# Patient Record
Sex: Female | Born: 1995 | Race: Black or African American | Hispanic: No | Marital: Single | Smoking: Never smoker
Health system: Southern US, Community
[De-identification: ages and names within clinical notes are randomized; demographics above are authoritative.]

## PROBLEM LIST (undated history)

## (undated) DIAGNOSIS — G43909 Migraine, unspecified, not intractable, without status migrainosus: Secondary | ICD-10-CM

## (undated) DIAGNOSIS — G935 Compression of brain: Secondary | ICD-10-CM

## (undated) HISTORY — PX: NO PAST SURGERIES: SHX2092

---

## 2017-05-02 ENCOUNTER — Encounter: Payer: Self-pay | Admitting: Emergency Medicine

## 2017-05-02 ENCOUNTER — Emergency Department
Admission: EM | Admit: 2017-05-02 | Discharge: 2017-05-03 | Disposition: A | Discharge status: Home / Self Care | Payer: BLUE CROSS/BLUE SHIELD | Attending: Emergency Medicine | Admitting: Emergency Medicine

## 2017-05-02 ENCOUNTER — Emergency Department: Payer: BLUE CROSS/BLUE SHIELD

## 2017-05-02 DIAGNOSIS — M25511 Pain in right shoulder: Secondary | ICD-10-CM | POA: Diagnosis not present

## 2017-05-02 HISTORY — DX: Compression of brain: G93.5

## 2017-05-02 MED ORDER — IBUPROFEN 400 MG PO TABS
400.0000 mg | ORAL_TABLET | Freq: Once | ORAL | Status: AC | PRN
  Administered 2017-05-02: 400 mg via ORAL

## 2017-05-02 MED ORDER — IBUPROFEN 400 MG PO TABS
ORAL_TABLET | ORAL | Status: AC
  Filled 2017-05-02: qty 1
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
## 2017-05-02 NOTE — ED Notes (Signed)
Ice pack given to pt.

## 2017-05-03 ENCOUNTER — Encounter: Payer: Self-pay | Admitting: Emergency Medicine

## 2017-05-03 MED ORDER — IBUPROFEN 600 MG PO TABS
600.0000 mg | ORAL_TABLET | Freq: Once | ORAL | Status: AC
  Administered 2017-05-03: 600 mg via ORAL
  Filled 2017-05-03: qty 1

## 2017-05-03 MED ORDER — ACETAMINOPHEN 500 MG PO TABS
1000.0000 mg | ORAL_TABLET | Freq: Once | ORAL | Status: AC
  Administered 2017-05-03: 1000 mg via ORAL
  Filled 2017-05-03: qty 2
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
## 2019-06-02 ENCOUNTER — Other Ambulatory Visit: Payer: Self-pay

## 2019-06-02 DIAGNOSIS — Z20822 Contact with and (suspected) exposure to covid-19: Secondary | ICD-10-CM

## 2019-06-04 LAB — NOVEL CORONAVIRUS, NAA: SARS-CoV-2, NAA: NOT DETECTED

## 2019-10-04 ENCOUNTER — Ambulatory Visit: Payer: PRIVATE HEALTH INSURANCE | Attending: Internal Medicine

## 2019-10-04 DIAGNOSIS — Z20822 Contact with and (suspected) exposure to covid-19: Secondary | ICD-10-CM

## 2019-10-05 LAB — NOVEL CORONAVIRUS, NAA: SARS-CoV-2, NAA: NOT DETECTED

## 2020-09-14 ENCOUNTER — Emergency Department
Admission: EM | Admit: 2020-09-14 | Discharge: 2020-09-14 | Disposition: A | Discharge status: Home / Self Care | Payer: BLUE CROSS/BLUE SHIELD | Attending: Emergency Medicine | Admitting: Emergency Medicine

## 2020-09-14 ENCOUNTER — Other Ambulatory Visit: Payer: Self-pay

## 2020-09-14 ENCOUNTER — Encounter: Payer: Self-pay | Admitting: Emergency Medicine

## 2020-09-14 ENCOUNTER — Emergency Department: Payer: BLUE CROSS/BLUE SHIELD

## 2020-09-14 DIAGNOSIS — G8929 Other chronic pain: Secondary | ICD-10-CM | POA: Insufficient documentation

## 2020-09-14 DIAGNOSIS — R519 Headache, unspecified: Secondary | ICD-10-CM | POA: Insufficient documentation

## 2020-09-14 MED ORDER — DIPHENHYDRAMINE HCL 25 MG PO CAPS
25.0000 mg | ORAL_CAPSULE | Freq: Once | ORAL | Status: AC
  Administered 2020-09-14: 25 mg via ORAL
  Filled 2020-09-14: qty 1

## 2020-09-14 MED ORDER — KETOROLAC TROMETHAMINE 30 MG/ML IJ SOLN
30.0000 mg | Freq: Once | INTRAMUSCULAR | Status: AC
  Administered 2020-09-14: 30 mg via INTRAMUSCULAR
  Filled 2020-09-14: qty 1

## 2020-09-14 MED ORDER — CYCLOBENZAPRINE HCL 5 MG PO TABS: 5.0000 mg | ORAL_TABLET | Freq: Three times a day (TID) | ORAL | 0 refills | Status: DC | PRN

## 2020-09-14 MED ORDER — KETOROLAC TROMETHAMINE 10 MG PO TABS: 10.0000 mg | ORAL_TABLET | Freq: Three times a day (TID) | ORAL | 0 refills | Status: DC

## 2020-09-14 MED ORDER — METOCLOPRAMIDE HCL 10 MG PO TABS: 10.0000 mg | ORAL_TABLET | Freq: Three times a day (TID) | ORAL | 0 refills | Status: DC | PRN

## 2020-09-14 MED ORDER — CYCLOBENZAPRINE HCL 10 MG PO TABS
10.0000 mg | ORAL_TABLET | Freq: Once | ORAL | Status: AC
  Administered 2020-09-14: 10 mg via ORAL
  Filled 2020-09-14: qty 1

## 2020-09-14 MED ORDER — METOCLOPRAMIDE HCL 10 MG PO TABS
10.0000 mg | ORAL_TABLET | Freq: Once | ORAL | Status: AC
  Administered 2020-09-14: 10 mg via ORAL
  Filled 2020-09-14: qty 1

## 2020-09-14 NOTE — ED Provider Notes (Signed)
[facility] Emergency Department Provider Note ____________________________________________  Time seen: 1955  I have reviewed the triage vital signs and the nursing notes.  HISTORY  Chief Complaint  Headache  HPI Beth Reese is a 24 y.o. female presents her self to the ED for evaluation of a chronic headache.  Patient describes a diagnosis when she was about 24 years old, in [place], of a Chiari malformation.  She reports she recalls her parents being told that surgery was an option, but they declined at that time.  Patient reports that she has had persistent near daily headaches, since that time.  She generally will take sporadic, low doses of Tylenol and Motrin  for headache pain relief, but reports she rarely is without headache pain.  She denies any recent trauma, falls, visual disturbance, dizziness, weakness, paresthesias or paralysis.  She presents to the ED today, because her typical headache is a less sharp in nature.  She also reports some movement of the headache from the occiput to the temple, to the forehead, and to the top of the head.  She denies any chest pain, shortness of breath, fever, chills, sweats but also denies any nausea, vomiting, or dizziness.  Patient has not establish care with a primary provider here nor she been referred to or evaluated by a neurologist.  She denies any other significant medical history, and takes no daily prescription medications.  Past Medical History:  Diagnosis Date  . Chiari malformation type I (HCC)     There are no problems to display for this patient.   History reviewed. No pertinent surgical history.  Prior to Admission medications   Medication Sig Start Date End Date Taking? Authorizing Provider  cyclobenzaprine (FLEXERIL) 5 MG tablet Take 1 tablet (5 mg total) by mouth 3 (three) times daily as needed. 09/14/20   Bufford Helms, Charlesetta Ivory, PA-C  ketorolac (TORADOL) 10 MG tablet Take 1 tablet (10 mg  total) by mouth every 8 (eight) hours. 09/14/20   Natara Monfort, Charlesetta Ivory, PA-C  metoCLOPramide (REGLAN) 10 MG tablet Take 1 tablet (10 mg total) by mouth every 8 (eight) hours as needed for up to 10 days for nausea or vomiting. 09/14/20 09/24/20  Uthman Mroczkowski, Charlesetta Ivory, PA-C    Allergies Penicillins  No family history on file.  Social History Social History   Tobacco Use  . Smoking status: Never Smoker  . Smokeless tobacco: Never Used  Substance Use Topics  . Alcohol use: No    Review of Systems  Constitutional: Negative for fever. Eyes: Negative for visual changes. ENT: Negative for sore throat. Cardiovascular: Negative for chest pain. Respiratory: Negative for shortness of breath. Gastrointestinal: Negative for abdominal pain, vomiting and diarrhea. Genitourinary: Negative for dysuria. Musculoskeletal: Negative for back pain. Skin: Negative for rash. Neurological: Positive for headaches. Denies focal weakness or numbness. ____________________________________________  PHYSICAL EXAM:  VITAL SIGNS: ED Triage Vitals  Enc Vitals Group     BP 09/14/20 1753 110/68     Pulse Rate 09/14/20 1753 61     Resp 09/14/20 1753 16     Temp 09/14/20 1753 98.4 F (36.9 C)     Temp Source 09/14/20 1753 Oral     SpO2 09/14/20 1753 100 %     Weight 09/14/20 1751 130 lb (59 kg)     Height 09/14/20 1751 5\' 4"  (1.626 m)     Head Circumference --      Peak Flow --      Pain Score  09/14/20 1751 7     Pain Loc --      Pain Edu? --      Excl. in GC? --     Constitutional: Alert and oriented. Well appearing and in no distress. GCS = 15 Head: Normocephalic and atraumatic. Eyes: Conjunctivae are normal. PERRL. Normal extraocular movements and fundi bilaterally  Neck: Supple. Normal ROM. No rigidity.  Cardiovascular: Normal rate, regular rhythm. Normal distal pulses. Respiratory: Normal respiratory effort. No wheezes/rales/rhonchi. Gastrointestinal: Soft and nontender. No  distention. Musculoskeletal: Nontender with normal range of motion in all extremities.  Neurologic:  Normal gait without ataxia. Normal speech and language. No gross focal neurologic deficits are appreciated. Skin:  Skin is warm, dry and intact. No rash noted. Psychiatric: Mood and affect are normal. Patient exhibits appropriate insight and judgment. ____________________________________________   RADIOLOGY  CT Head w/o CM  Negative ____________________________________________  PROCEDURES  Toradol 30 mg IM Reglan 10 mg PO Benadryl 25 mg PO Flexeril 10 mg PO  Procedures ____________________________________________  INITIAL IMPRESSION / ASSESSMENT AND PLAN / ED COURSE  Differential diagnosis includes, but is not limited to, intracranial hemorrhage, meningitis/encephalitis, previous head trauma, cavernous venous thrombosis, tension headache, temporal arteritis, migraine or migraine equivalent, idiopathic intracranial hypertension, and non-specific headache.  The ED evaluation of chronic headache syndrome diagnosis as a pediatric patient with Chiari malformation, presents with worsening headache over the last week.  Patient's exam is overall benign reassurance time.  No signs of acute neuromuscular deficit, visual disturbance, or acute cerebellar ataxia.  She has not had follow-up since her diagnosis as a teenager.  She does indicate that time of disposition that her headache pain is improved to a 3 out of 10 at this time.  She will continue to monitor and treat symptoms of the headache as discussed.  She will be referred to neurology for further evaluation definitive management of her headache syndrome.  Beth Reese was evaluated in Emergency Department on 09/14/2020 for the symptoms described in the history of present illness. She was evaluated in the context of the global COVID-19 pandemic, which necessitated consideration that the patient might be at risk for infection with the  SARS-CoV-2 virus that causes COVID-19. Institutional protocols and algorithms that pertain to the evaluation of patients at risk for COVID-19 are in a state of rapid change based on information released by regulatory bodies including the CDC and federal and state organizations. These policies and algorithms were followed during the patient's care in the ED. ____________________________________________  FINAL CLINICAL IMPRESSION(S) / ED DIAGNOSES  Final diagnoses:  Headache disorder      Karmen Stabs, Charlesetta Ivory, PA-C 09/14/20 2225    Merwyn Katos, MD 09/15/20 [phone]

## 2020-09-14 NOTE — Discharge Instructions (Signed)
Your exam and CT scan are normal at this time.  You should follow-up with [facility] neurology for further management of your headache syndrome.  Take the prescription medications as prescribed.  Also use over-the-counter Benadryl as discussed for headache management.  Return to the ED if needed.
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
## 2020-09-14 NOTE — ED Triage Notes (Signed)
Pt to ED via POV stating that she has been having headaches over the last week and a half. Pt is in NAD.

## 2020-09-20 ENCOUNTER — Other Ambulatory Visit: Payer: Self-pay

## 2020-09-20 ENCOUNTER — Emergency Department
Admission: EM | Admit: 2020-09-20 | Discharge: 2020-09-21 | Discharge status: Against Medical Advice | Payer: PRIVATE HEALTH INSURANCE

## 2020-09-20 ENCOUNTER — Ambulatory Visit
Admission: EM | Admit: 2020-09-20 | Discharge: 2020-09-20 | Disposition: A | Discharge status: Home / Self Care | Payer: BLUE CROSS/BLUE SHIELD | Attending: Physician Assistant | Admitting: Physician Assistant

## 2020-09-20 ENCOUNTER — Encounter: Payer: Self-pay | Admitting: Emergency Medicine

## 2020-09-20 DIAGNOSIS — G8929 Other chronic pain: Secondary | ICD-10-CM | POA: Diagnosis not present

## 2020-09-20 DIAGNOSIS — R519 Headache, unspecified: Secondary | ICD-10-CM | POA: Diagnosis not present

## 2020-09-20 DIAGNOSIS — R110 Nausea: Secondary | ICD-10-CM

## 2020-09-20 HISTORY — DX: Migraine, unspecified, not intractable, without status migrainosus: G43.909

## 2020-09-20 MED ORDER — PROMETHAZINE HCL 25 MG PO TABS: 25.0000 mg | ORAL_TABLET | Freq: Four times a day (QID) | ORAL | 0 refills | Status: DC | PRN

## 2020-09-20 MED ORDER — RIZATRIPTAN BENZOATE 10 MG PO TABS: ORAL_TABLET | ORAL | 0 refills | Status: DC

## 2020-09-20 MED ORDER — PREDNISONE 50 MG PO TABS: 50.0000 mg | ORAL_TABLET | Freq: Every day | ORAL | 0 refills | Status: AC

## 2020-09-20 NOTE — Discharge Instructions (Signed)
When you get home, take the rizatriptan tablet with the promethazine tablet.  Can repeat rizatriptan in 2 hours if your headache is not improved.  You can take the promethazine as needed for nausea/vomiting or when you take the rizatriptan.  Wait to start the prednisone until tomorrow if you have not had any improvement with this regimen today.  Keep your follow-up with the neurologist.  Go back to the emergency department for any severe acute worsening of your headache or other associated symptoms of dizziness, weakness, confusion, speech problems, balance difficulty, falls, etc.

## 2020-09-20 NOTE — ED Triage Notes (Signed)
Patient in today c/o migraine x this morning. Patient states she has chronic migraines and was seen in at [facility] ED on 09/14/20. Patient had a head CT at that time that was normal. Patient states ED prescribed medication, but states it isn't helping. Patient has not taken any medications today for her headache.

## 2020-09-20 NOTE — ED Provider Notes (Signed)
[facility]    CSN: [id number] Arrival date & time: 09/20/20  1035      History   Chief Complaint Chief Complaint  Patient presents with  . Migraine    HPI Beth Reese is a 24 y.o. female presenting for evaluation and management of chronic headaches for nearly the past 7 years.  She states that she has headaches almost every day.  She says that usually they are dull but sometimes symptoms worsen.  Currently she states that she has had increased throbbing pain of the frontal region and occipital part of her head.  She said this is associated with nausea, but not vomiting.  Admits to occasional dizziness and lightheadedness, but denies any falls or syncope.  Denies any visual changes.  Denies any confusion or balance difficulty.  Denies any numbness, tingling or weakness.    Patient is to history of Chiari malformation which was diagnosed when she was a teenager.  Patient states that she was advised when she was younger that she may need to have a surgery, but has not followed up with anyone in the last several years about this.  Patient states that she was seen in the ED a few days ago for her headache/migraine.  She states that she had a CT scan at that time and received multiple medications which seem to help her headache.  She was prescribed the same medications which are Toradol, Reglan, and Flexeril.  Patient states that she took those medications that helped her previously and states that they do not seem to have helped her since.  Patient denies any worsening of headaches, but says she has not had any improvement.  She does state that her current headache is not as bad as it was when she went to the ED, however.   Patient states that she normally takes ibuprofen and Tylenol for headaches and sometimes it works, but recently her headaches seem to be worse than normal and not responding to the usual medications that she is tried.  She did get a referral to a neurologist  when she went to the ED, but has not received a call back from them yet.  Patient states that she is supposed to work today at 3 PM and does not think she can work and request a work note.  She has no other concerns today.  HPI  Past Medical History:  Diagnosis Date  . Chiari malformation type I (HCC)   . Migraine     There are no problems to display for this patient.   Past Surgical History:  Procedure Laterality Date  . NO PAST SURGERIES      OB History   No obstetric history on file.      Home Medications    Prior to Admission medications   Medication Sig Start Date End Date Taking? Authorizing Provider  cyclobenzaprine (FLEXERIL) 5 MG tablet Take 1 tablet (5 mg total) by mouth 3 (three) times daily as needed. 09/14/20  Yes Menshew, Charlesetta Ivory, PA-C  ketorolac (TORADOL) 10 MG tablet Take 1 tablet (10 mg total) by mouth every 8 (eight) hours. 09/14/20  Yes Menshew, Charlesetta Ivory, PA-C  metoCLOPramide (REGLAN) 10 MG tablet Take 1 tablet (10 mg total) by mouth every 8 (eight) hours as needed for up to 10 days for nausea or vomiting. 09/14/20 09/24/20 Yes Menshew, Charlesetta Ivory, PA-C  predniSONE (DELTASONE) 50 MG tablet Take 1 tablet (50 mg total) by mouth daily for 5 days.  09/20/20 09/25/20  Shirlee LatchEaves, Faustina Gebert B, PA-C  promethazine (PHENERGAN) 25 MG tablet Take 1 tablet (25 mg total) by mouth every 6 (six) hours as needed for up to 10 days for nausea or vomiting. 09/20/20 09/30/20  Eusebio FriendlyEaves, Rheda Kassab B, PA-C  rizatriptan (MAXALT) 10 MG tablet Take 1 tablet by mouth as needed for headache.  May repeat in 2 hours if needed 09/20/20 10/04/20  Gareth MorganEaves, Saleah Rishel B, PA-C    Family History Family History  Problem Relation Age of Onset  . Healthy Mother   . Diabetes Father   . Kidney disease Father   . Kidney failure Father   . Stroke Father   . Heart attack Father     Social History Social History   Tobacco Use  . Smoking status: Never Smoker  . Smokeless tobacco: Never Used   Vaping Use  . Vaping Use: Never used  Substance Use Topics  . Alcohol use: No  . Drug use: Never     Allergies   Penicillins   Review of Systems Review of Systems  Constitutional: Negative for fatigue and fever.  Eyes: Negative for photophobia and visual disturbance.  Gastrointestinal: Positive for nausea. Negative for vomiting.  Musculoskeletal: Positive for neck pain (intermittent). Negative for back pain.  Neurological: Positive for dizziness, light-headedness and headaches. Negative for tremors, seizures, syncope, facial asymmetry, speech difficulty, weakness and numbness.  Psychiatric/Behavioral: Negative for agitation and confusion.     Physical Exam Triage Vital Signs ED Triage Vitals  Enc Vitals Group     BP 09/20/20 1100 108/76     Pulse Rate 09/20/20 1100 (!) 101     Resp 09/20/20 1100 18     Temp 09/20/20 1100 98.3 F (36.8 C)     Temp Source 09/20/20 1100 Oral     SpO2 09/20/20 1100 99 %     Weight 09/20/20 1101 130 lb (59 kg)     Height 09/20/20 1101 5\' 4"  (1.626 m)     Head Circumference --      Peak Flow --      Pain Score 09/20/20 1100 7     Pain Loc --      Pain Edu? --      Excl. in [place]? --    No data found.  Updated Vital Signs BP 108/76 (BP Location: Left Arm)   Pulse (!) 101   Temp 98.3 F (36.8 C) (Oral)   Resp 18   Ht 5\' 4"  (1.626 m)   Wt 130 lb (59 kg)   LMP 09/09/2020 (Approximate)   SpO2 99%   BMI 22.31 kg/m     Physical Exam Vitals and nursing note reviewed.  Constitutional:      General: She is not in acute distress.    Appearance: Normal appearance. She is not ill-appearing or toxic-appearing.  HENT:     Head: Normocephalic and atraumatic.     Nose: Nose normal.     Mouth/Throat:     Mouth: Mucous membranes are moist.     Pharynx: Oropharynx is clear.  Eyes:     General: No scleral icterus.       Right eye: No discharge.        Left eye: No discharge.     Extraocular Movements: Extraocular movements intact.      Conjunctiva/sclera: Conjunctivae normal.     Pupils: Pupils are equal, round, and reactive to light.  Cardiovascular:     Rate and Rhythm: Normal rate and regular rhythm.  Heart sounds: Normal heart sounds.  Pulmonary:     Effort: Pulmonary effort is normal. No respiratory distress.     Breath sounds: Normal breath sounds.  Musculoskeletal:     Cervical back: Normal range of motion and neck supple. Tenderness (mild diffuse C-spine tenderness) present. No rigidity.  Skin:    General: Skin is dry.  Neurological:     General: No focal deficit present.     Mental Status: She is alert and oriented to person, place, and time. Mental status is at baseline.     Cranial Nerves: No cranial nerve deficit.     Motor: No weakness.     Coordination: Coordination normal (normal finger to nose, heel-shin, and rapid toe tapping).     Gait: Gait normal.     Comments: 5/5 strength bilat UEs and LEs  Psychiatric:        Mood and Affect: Mood normal.        Behavior: Behavior normal.        Thought Content: Thought content normal.      UC Treatments / Results  Labs (all labs ordered are listed, but only abnormal results are displayed) Labs Reviewed - No data to display  EKG   Radiology No results found.  Procedures Procedures (including critical care time)  Medications Ordered in UC Medications - No data to display  Initial Impression / Assessment and Plan / UC Course  I have reviewed the triage vital signs and the nursing notes.  Pertinent labs & imaging results that were available during my care of the patient were reviewed by me and considered in my medical decision making (see chart for details).   24 year old female presenting for chronic headaches over the past 7 years.  Patient was seen in the ED on 09/14/2020.  I was able to review the progress notes and imaging studies from her ED visit.  CT scan of head was without any abnormalities.  Her neurological exam was normal at that  time.  She was given 10 mg cyclobenzaprine, 30 mg IM ketorolac, 25 mg diphenhydramine, and 10 mg metoclopramide at the ED.  She stated that she was feeling better after receiving these medications.  She was sent home with cyclobenzaprine, metoclopramide, and ketorolac.  Today, patient's neurological exam is within normal limits.  I offered her dexamethasone injection in the clinic and also trying Imitrex.  Patient did not want to have any injections performed at this time and ask for prescription medications.  I have sent rizatriptan and promethazine to pharmacy for patient to try for her acute headache.  I also sent prednisone for her to start tomorrow if the rizatriptan and promethazine do not seem to have helped.  Advised she can take Tylenol as well if she needs it.  Patient does have the contact information for the neurologist who she was referred to.  Advised her to contact neurologist at [address] [place], [place], [place] [address]  [phone] if she has not heard anything in the next week.  Patient has been referred to [facility], MD  Discussed ED precautions with patient and advised her that if she has any severe acute worsening of any of her symptoms especially severe worsening of the headache, dizziness, visual changes, speech problems or balance difficulty, numbness/weakness/tingling, severe neck pain, or feeling faint or passing out, to call EMS or have someone take her immediately to the emergency department to see if she may need an emergent MRI of her brain and  spinal cord given her history of Chiari malformation.  Patient is understanding agreeable.  Final Clinical Impressions(s) / UC Diagnoses   Final diagnoses:  Acute nonintractable headache, unspecified headache type  Chronic nonintractable headache, unspecified headache type  Nausea without vomiting     Discharge Instructions     When you get home, take the rizatriptan tablet with the promethazine tablet.  Can repeat  rizatriptan in 2 hours if your headache is not improved.  You can take the promethazine as needed for nausea/vomiting or when you take the rizatriptan.  Wait to start the prednisone until tomorrow if you have not had any improvement with this regimen today.  Keep your follow-up with the neurologist.  Go back to the emergency department for any severe acute worsening of your headache or other associated symptoms of dizziness, weakness, confusion, speech problems, balance difficulty, falls, etc.    ED Prescriptions    Medication Sig Dispense Auth. Provider   rizatriptan (MAXALT) 10 MG tablet Take 1 tablet by mouth as needed for headache.  May repeat in 2 hours if needed 12 tablet Shirlee Latch, PA-C   promethazine (PHENERGAN) 25 MG tablet Take 1 tablet (25 mg total) by mouth every 6 (six) hours as needed for up to 10 days for nausea or vomiting. 30 tablet Eusebio Friendly B, PA-C   predniSONE (DELTASONE) 50 MG tablet Take 1 tablet (50 mg total) by mouth daily for 5 days. 5 tablet Gareth Morgan     PDMP not reviewed this encounter.   Shirlee Latch, PA-C 09/20/20 1152

## 2020-10-11 ENCOUNTER — Ambulatory Visit
Admission: EM | Admit: 2020-10-11 | Discharge: 2020-10-11 | Disposition: A | Discharge status: Home / Self Care | Payer: BLUE CROSS/BLUE SHIELD | Attending: Family Medicine | Admitting: Family Medicine

## 2020-10-11 DIAGNOSIS — G8929 Other chronic pain: Secondary | ICD-10-CM | POA: Diagnosis not present

## 2020-10-11 DIAGNOSIS — R519 Headache, unspecified: Secondary | ICD-10-CM | POA: Diagnosis not present

## 2020-10-11 MED ORDER — KETOROLAC TROMETHAMINE 30 MG/ML IJ SOLN
30.0000 mg | Freq: Once | INTRAMUSCULAR | Status: AC
  Administered 2020-10-11: 30 mg via INTRAMUSCULAR

## 2020-10-11 MED ORDER — MECLIZINE HCL 25 MG PO TABS: 25.0000 mg | ORAL_TABLET | Freq: Three times a day (TID) | ORAL | 0 refills | Status: AC | PRN

## 2020-10-11 MED ORDER — PROMETHAZINE HCL 25 MG PO TABS: 25.0000 mg | ORAL_TABLET | Freq: Four times a day (QID) | ORAL | 0 refills | Status: DC | PRN

## 2020-10-11 MED ORDER — DEXAMETHASONE SODIUM PHOSPHATE 10 MG/ML IJ SOLN
10.0000 mg | Freq: Once | INTRAMUSCULAR | Status: AC
  Administered 2020-10-11: 10 mg via INTRAMUSCULAR

## 2020-10-11 NOTE — Discharge Instructions (Addendum)
Treated you for your headache here  Refilled the nausea medicine and prescribed meclizine for dizziness.  Follow up with the neurologist as planned  Follow up as needed for continued or worsening symptoms

## 2020-10-11 NOTE — ED Triage Notes (Signed)
Pt presents with c/o headache and dizziness for past month. Pt states she has been to ER 5 times for same

## 2020-10-12 NOTE — ED Provider Notes (Signed)
Beth Reese    CSN: [id number] Arrival date & time: 10/11/20  1149      History   Chief Complaint Chief Complaint  Patient presents with  . Headache  . Dizziness    HPI Beth Reese is a 25 y.o. female.   Patient is a 25 year old female with past medical history of Chiari malformation, migraines.  She has had a migraine for the past month and a half.  She is here today requesting treatment for this.  Has an appointment to see neurology in February.  Has been seen in the ER approximately 5 times for similar and given migraine cocktail.  Takes Tylenol and ibuprofen alternating at home for her headaches which does not help much.  She is out of her nausea medicine and is requesting something for dizziness.  Denies any new changes with the headache.      Past Medical History:  Diagnosis Date  . Chiari malformation type I (HCC)   . Migraine     There are no problems to display for this patient.   Past Surgical History:  Procedure Laterality Date  . NO PAST SURGERIES      OB History   No obstetric history on file.      Home Medications    Prior to Admission medications   Medication Sig Start Date End Date Taking? Authorizing Provider  meclizine (ANTIVERT) 25 MG tablet Take 1 tablet (25 mg total) by mouth 3 (three) times daily as needed for dizziness. Take 1/2 tab to 1 tab as needed. 10/11/20  Yes Bast, Traci A, NP  cyclobenzaprine (FLEXERIL) 5 MG tablet Take 1 tablet (5 mg total) by mouth 3 (three) times daily as needed. 09/14/20   Menshew, Charlesetta Ivory, PA-C  ketorolac (TORADOL) 10 MG tablet Take 1 tablet (10 mg total) by mouth every 8 (eight) hours. 09/14/20   Menshew, Charlesetta Ivory, PA-C  metoCLOPramide (REGLAN) 10 MG tablet Take 1 tablet (10 mg total) by mouth every 8 (eight) hours as needed for up to 10 days for nausea or vomiting. 09/14/20 09/24/20  Menshew, Charlesetta Ivory, PA-C  promethazine (PHENERGAN) 25 MG tablet Take 1 tablet (25 mg total) by  mouth every 6 (six) hours as needed for up to 10 days for nausea or vomiting. 10/11/20 10/21/20  Dahlia Byes A, NP  rizatriptan (MAXALT) 10 MG tablet Take 1 tablet by mouth as needed for headache.  May repeat in 2 hours if needed 09/20/20 10/04/20  Gareth Morgan    Family History Family History  Problem Relation Age of Onset  . Healthy Mother   . Diabetes Father   . Kidney disease Father   . Kidney failure Father   . Stroke Father   . Heart attack Father     Social History Social History   Tobacco Use  . Smoking status: Never Smoker  . Smokeless tobacco: Never Used  Vaping Use  . Vaping Use: Never used  Substance Use Topics  . Alcohol use: No  . Drug use: Never     Allergies   Penicillins   Review of Systems Review of Systems   Physical Exam Triage Vital Signs ED Triage Vitals [10/11/20 1211]  Enc Vitals Group     BP 107/77     Pulse Rate 74     Resp 16     Temp 98.9 F (37.2 C)     Temp src      SpO2 98 %  Weight      Height      Head Circumference      Peak Flow      Pain Score      Pain Loc      Pain Edu?      Excl. in [place]?    No data found.  Updated Vital Signs BP 107/77   Pulse 74   Temp 98.9 F (37.2 C)   Resp 16   LMP 10/10/2020   SpO2 98%   Visual Acuity Right Eye Distance:   Left Eye Distance:   Bilateral Distance:    Right Eye Near:   Left Eye Near:    Bilateral Near:     Physical Exam Vitals and nursing note reviewed.  Constitutional:      General: She is not in acute distress.    Appearance: Normal appearance. She is not ill-appearing, toxic-appearing or diaphoretic.  HENT:     Head: Normocephalic.     Nose: Nose normal.  Eyes:     Conjunctiva/sclera: Conjunctivae normal.  Pulmonary:     Effort: Pulmonary effort is normal.  Musculoskeletal:        General: Normal range of motion.     Cervical back: Normal range of motion.  Skin:    General: Skin is warm and dry.     Findings: No rash.  Neurological:      General: No focal deficit present.     Mental Status: She is alert.     Cranial Nerves: No cranial nerve deficit.     Motor: No weakness.  Psychiatric:        Mood and Affect: Mood normal.      UC Treatments / Results  Labs (all labs ordered are listed, but only abnormal results are displayed) Labs Reviewed - No data to display  EKG   Radiology No results found.  Procedures Procedures (including critical care time)  Medications Ordered in UC Medications  ketorolac (TORADOL) 30 MG/ML injection 30 mg (30 mg Intramuscular Given 10/11/20 1240)  dexamethasone (DECADRON) injection 10 mg (10 mg Intramuscular Given 10/11/20 1240)    Initial Impression / Assessment and Plan / UC Course  I have reviewed the triage vital signs and the nursing notes.  Pertinent labs & imaging results that were available during my care of the patient were reviewed by me and considered in my medical decision making (see chart for details).     Chronic headache Treated with migraine cocktail here today.  No concerns on exam or any neurological deficits. Refill the nausea medicine and prescribe meclizine for dizziness. Follow-up with neurologist as planned in February Final Clinical Impressions(s) / UC Diagnoses   Final diagnoses:  Chronic intractable headache, unspecified headache type     Discharge Instructions     Treated you for your headache here  Refilled the nausea medicine and prescribed meclizine for dizziness.  Follow up with the neurologist as planned  Follow up as needed for continued or worsening symptoms     ED Prescriptions    Medication Sig Dispense Auth. Provider   promethazine (PHENERGAN) 25 MG tablet Take 1 tablet (25 mg total) by mouth every 6 (six) hours as needed for up to 10 days for nausea or vomiting. 30 tablet Bast, Traci A, NP   meclizine (ANTIVERT) 25 MG tablet Take 1 tablet (25 mg total) by mouth 3 (three) times daily as needed for dizziness. Take 1/2 tab to 1 tab  as needed. 30 tablet Orvan July, NP  PDMP not reviewed this encounter.   Janace Aris, NP 10/12/20 [phone]

## 2020-10-22 ENCOUNTER — Ambulatory Visit: Payer: PRIVATE HEALTH INSURANCE | Admitting: Medical-Surgical

## 2020-11-20 ENCOUNTER — Other Ambulatory Visit: Payer: Self-pay | Admitting: Neurology

## 2020-11-20 ENCOUNTER — Other Ambulatory Visit (HOSPITAL_COMMUNITY): Payer: Self-pay | Admitting: Neurology

## 2020-11-20 DIAGNOSIS — G43019 Migraine without aura, intractable, without status migrainosus: Secondary | ICD-10-CM

## 2020-11-20 DIAGNOSIS — R531 Weakness: Secondary | ICD-10-CM

## 2020-11-20 DIAGNOSIS — R200 Anesthesia of skin: Secondary | ICD-10-CM

## 2020-11-20 DIAGNOSIS — Q0701 Arnold-Chiari syndrome with spina bifida: Secondary | ICD-10-CM

## 2020-11-20 DIAGNOSIS — R202 Paresthesia of skin: Secondary | ICD-10-CM

## 2020-12-04 ENCOUNTER — Ambulatory Visit (HOSPITAL_COMMUNITY): Payer: PRIVATE HEALTH INSURANCE

## 2020-12-04 ENCOUNTER — Ambulatory Visit (HOSPITAL_COMMUNITY): Admission: RE | Admit: 2020-12-04 | Payer: PRIVATE HEALTH INSURANCE | Source: Ambulatory Visit

## 2020-12-17 ENCOUNTER — Encounter (HOSPITAL_COMMUNITY): Payer: Self-pay

## 2020-12-17 ENCOUNTER — Ambulatory Visit (HOSPITAL_COMMUNITY): Admission: RE | Admit: 2020-12-17 | Payer: BLUE CROSS/BLUE SHIELD | Source: Ambulatory Visit

## 2020-12-17 ENCOUNTER — Ambulatory Visit (HOSPITAL_COMMUNITY): Payer: BLUE CROSS/BLUE SHIELD | Attending: Neurology

## 2020-12-17 ENCOUNTER — Other Ambulatory Visit (HOSPITAL_COMMUNITY): Payer: Self-pay | Admitting: Student in an Organized Health Care Education/Training Program

## 2021-02-20 ENCOUNTER — Ambulatory Visit
Admission: EM | Admit: 2021-02-20 | Discharge: 2021-02-20 | Disposition: A | Discharge status: Home / Self Care | Payer: BLUE CROSS/BLUE SHIELD

## 2021-02-20 ENCOUNTER — Other Ambulatory Visit: Payer: Self-pay

## 2021-02-20 ENCOUNTER — Encounter: Payer: Self-pay | Admitting: Emergency Medicine

## 2021-02-20 DIAGNOSIS — R519 Headache, unspecified: Secondary | ICD-10-CM

## 2021-02-20 MED ORDER — DEXAMETHASONE SODIUM PHOSPHATE 10 MG/ML IJ SOLN
10.0000 mg | Freq: Once | INTRAMUSCULAR | Status: AC
  Administered 2021-02-20: 10 mg via INTRAMUSCULAR

## 2021-02-20 MED ORDER — KETOROLAC TROMETHAMINE 30 MG/ML IJ SOLN
30.0000 mg | Freq: Once | INTRAMUSCULAR | Status: AC
  Administered 2021-02-20: 30 mg via INTRAMUSCULAR

## 2021-02-20 NOTE — ED Provider Notes (Signed)
Beth Reese    CSN: [id number] Arrival date & time: 02/20/21  1236      History   Chief Complaint Chief Complaint  Patient presents with  . Headache    HPI Beth Reese is a 25 y.o. female.   Patient presents with 1 day history of headache which feels similar to her usual migraine headaches.  She also reports bilateral jaw pain and swelling which resolved with ice packs.  She denies fever, chills, tooth ache, sinus congestion, sore throat, ear pain, cough, shortness of breath, numbness, weakness, dizziness, or other symptoms.  Treatment attempted at home with daily use of nortriptyline which was prescribed by her neurologist.  Patient also attempted treatment with Tylenol without relief.  She has not used the Imitrex that was prescribed by her neurologist.  Patient is followed by [facility] neurology for intractable migraine headaches; last seen on 11/14/2020 per medical record; started on sumatriptan hand as needed for headaches and nortriptyline nightly.  Her medical history includes migraine headaches and Chiari malformation.    The history is provided by the patient and medical records.    Past Medical History:  Diagnosis Date  . Chiari malformation type I (HCC)   . Migraine     There are no problems to display for this patient.   Past Surgical History:  Procedure Laterality Date  . NO PAST SURGERIES      OB History   No obstetric history on file.      Home Medications    Prior to Admission medications   Medication Sig Start Date End Date Taking? Authorizing Provider  escitalopram (LEXAPRO) 5 MG tablet Take 5 mg by mouth daily. 01/30/21  Yes [provider]  nortriptyline (PAMELOR) 10 MG capsule Take 10 mg by mouth at bedtime. 01/31/21  Yes [provider]  Vitamin D, Ergocalciferol, (DRISDOL) 1.25 MG (50000 UNIT) CAPS capsule Take 1 capsule by mouth once a week. 11/23/20  Yes [provider]  cyclobenzaprine (FLEXERIL) 5 MG tablet  Take 1 tablet (5 mg total) by mouth 3 (three) times daily as needed. 09/14/20   Menshew, Charlesetta Ivory, PA-C  ketorolac (TORADOL) 10 MG tablet Take 1 tablet (10 mg total) by mouth every 8 (eight) hours. 09/14/20   Menshew, Charlesetta Ivory, PA-C  meclizine (ANTIVERT) 25 MG tablet Take 1 tablet (25 mg total) by mouth 3 (three) times daily as needed for dizziness. Take 1/2 tab to 1 tab as needed. 10/11/20   Janace Aris, NP  metoCLOPramide (REGLAN) 10 MG tablet Take 1 tablet (10 mg total) by mouth every 8 (eight) hours as needed for up to 10 days for nausea or vomiting. 09/14/20 09/24/20  Menshew, Charlesetta Ivory, PA-C  promethazine (PHENERGAN) 25 MG tablet Take 1 tablet (25 mg total) by mouth every 6 (six) hours as needed for up to 10 days for nausea or vomiting. 10/11/20 10/21/20  Dahlia Byes A, NP  rizatriptan (MAXALT) 10 MG tablet Take 1 tablet by mouth as needed for headache.  May repeat in 2 hours if needed 09/20/20 10/04/20  Gareth Morgan    Family History Family History  Problem Relation Age of Onset  . Healthy Mother   . Diabetes Father   . Kidney disease Father   . Kidney failure Father   . Stroke Father   . Heart attack Father     Social History Social History   Tobacco Use  . Smoking status: Never Smoker  . Smokeless tobacco:  Never Used  Vaping Use  . Vaping Use: Never used  Substance Use Topics  . Alcohol use: No  . Drug use: Never     Allergies   Penicillins   Review of Systems Review of Systems  Constitutional: Negative for chills and fever.  HENT: Positive for facial swelling. Negative for congestion, dental problem, ear pain, postnasal drip, rhinorrhea and sore throat.   Eyes: Negative for pain and visual disturbance.  Respiratory: Negative for cough and shortness of breath.   Cardiovascular: Negative for chest pain and palpitations.  Gastrointestinal: Negative for abdominal pain and vomiting.  Skin: Negative for color change and rash.  Neurological:  Positive for headaches. Negative for dizziness, tremors, seizures, syncope, facial asymmetry, speech difficulty, weakness, light-headedness and numbness.  All other systems reviewed and are negative.    Physical Exam Triage Vital Signs ED Triage Vitals  Enc Vitals Group     BP      Pulse      Resp      Temp      Temp src      SpO2      Weight      Height      Head Circumference      Peak Flow      Pain Score      Pain Loc      Pain Edu?      Excl. in GC?    No data found.  Updated Vital Signs BP 111/74 (BP Location: Right Arm)   Pulse 87   Temp 98.7 F (37.1 C) (Oral)   Resp 12   LMP 02/13/2021   SpO2 96%   Visual Acuity Right Eye Distance:   Left Eye Distance:   Bilateral Distance:    Right Eye Near:   Left Eye Near:    Bilateral Near:     Physical Exam Vitals and nursing note reviewed.  Constitutional:      General: She is not in acute distress.    Appearance: She is well-developed. She is not ill-appearing.  HENT:     Head: Normocephalic and atraumatic.     Right Ear: Tympanic membrane normal.     Left Ear: Tympanic membrane normal.     Nose: Nose normal.     Mouth/Throat:     Mouth: Mucous membranes are moist.     Pharynx: Oropharynx is clear.     Comments: No facial swelling.  Speech clear.  No oropharyngeal swelling.  No difficulty swallowing. Eyes:     Conjunctiva/sclera: Conjunctivae normal.  Cardiovascular:     Rate and Rhythm: Normal rate and regular rhythm.     Heart sounds: Normal heart sounds.  Pulmonary:     Effort: Pulmonary effort is normal. No respiratory distress.     Breath sounds: Normal breath sounds.  Abdominal:     Palpations: Abdomen is soft.     Tenderness: There is no abdominal tenderness.  Musculoskeletal:        General: No swelling, tenderness, deformity or signs of injury. Normal range of motion.     Cervical back: Neck supple.  Skin:    General: Skin is warm and dry.     Findings: No bruising, erythema, lesion or  rash.  Neurological:     General: No focal deficit present.     Mental Status: She is alert and oriented to person, place, and time.     Cranial Nerves: No cranial nerve deficit.     Sensory: No sensory deficit.  Motor: No weakness.     Gait: Gait normal.  Psychiatric:        Mood and Affect: Mood normal.        Behavior: Behavior normal.      UC Treatments / Results  Labs (all labs ordered are listed, but only abnormal results are displayed) Labs Reviewed - No data to display  EKG   Radiology No results found.  Procedures Procedures (including critical care time)  Medications Ordered in UC Medications  ketorolac (TORADOL) 30 MG/ML injection 30 mg (has no administration in time range)  dexamethasone (DECADRON) injection 10 mg (has no administration in time range)    Initial Impression / Assessment and Plan / UC Course  I have reviewed the triage vital signs and the nursing notes.  Pertinent labs & imaging results that were available during my care of the patient were reviewed by me and considered in my medical decision making (see chart for details).   Acute intractable headache.  Patient is well-appearing, vital signs stable, exam reassuring.  She has no facial swelling.  She is neurologically intact.  Her headache is similar to previous migraine headaches.  Treated today with dexamethasone and ketorolac.  Instructed patient to go to the emergency department if she has acute worsening symptoms; and to follow-up with her PCP or neurologist tomorrow.  Work note provided per patient request.  Patient agrees to plan of care.   Final Clinical Impressions(s) / UC Diagnoses   Final diagnoses:  Acute intractable headache, unspecified headache type     Discharge Instructions     You were given an injection of dexamethasone and ketorolac today for your headache.    Go to the emergency department if you have acute worsening symptoms.    Follow-up with your primary  care provider or neurologist tomorrow.        ED Prescriptions    None     PDMP not reviewed this encounter.   Mickie Bail, NP 02/20/21 1326

## 2021-02-20 NOTE — ED Triage Notes (Signed)
Patient c/o headache x 1 day.   Patient endorses bilateral facial pain/swelling, bilateral jaw pain, and "pain behind the eyes".   Patient denies trauma or fall.   History of Migraines per patient statement.   Patient has taken Tylenol and Nortriptyline with no relief of symptoms.

## 2021-02-20 NOTE — Discharge Instructions (Signed)
You were given an injection of dexamethasone and ketorolac today for your headache.    Go to the emergency department if you have acute worsening symptoms.    Follow-up with your primary care provider or neurologist tomorrow.

## 2021-06-06 ENCOUNTER — Ambulatory Visit
Admission: EM | Admit: 2021-06-06 | Discharge: 2021-06-06 | Disposition: A | Discharge status: Home / Self Care | Payer: BLUE CROSS/BLUE SHIELD | Attending: Emergency Medicine | Admitting: Emergency Medicine

## 2021-06-06 ENCOUNTER — Other Ambulatory Visit: Payer: Self-pay

## 2021-06-06 DIAGNOSIS — R059 Cough, unspecified: Secondary | ICD-10-CM | POA: Insufficient documentation

## 2021-06-06 DIAGNOSIS — U071 COVID-19: Secondary | ICD-10-CM | POA: Insufficient documentation

## 2021-06-06 DIAGNOSIS — R110 Nausea: Secondary | ICD-10-CM | POA: Insufficient documentation

## 2021-06-06 DIAGNOSIS — J069 Acute upper respiratory infection, unspecified: Secondary | ICD-10-CM | POA: Insufficient documentation

## 2021-06-06 DIAGNOSIS — Z880 Allergy status to penicillin: Secondary | ICD-10-CM | POA: Insufficient documentation

## 2021-06-06 LAB — POCT RAPID STREP A: Streptococcus, Group A Screen (Direct): NEGATIVE

## 2021-06-06 MED ORDER — BENZONATATE 100 MG PO CAPS: 200.0000 mg | ORAL_CAPSULE | Freq: Three times a day (TID) | ORAL | 0 refills | Status: AC

## 2021-06-06 MED ORDER — IPRATROPIUM BROMIDE 0.06 % NA SOLN: 2.0000 | Freq: Four times a day (QID) | NASAL | 12 refills | Status: AC

## 2021-06-06 MED ORDER — PROMETHAZINE-DM 6.25-15 MG/5ML PO SYRP: 5.0000 mL | ORAL_SOLUTION | Freq: Four times a day (QID) | ORAL | 0 refills | Status: AC | PRN

## 2021-06-06 NOTE — ED Triage Notes (Signed)
Patient complains of body aches, cough, sore throat and nasal congestion x 4 days.

## 2021-06-06 NOTE — Discharge Instructions (Addendum)
Isolate at home pending the results of your COVID test.  If you test positive then you will have to quarantine for 5 days from the start of your symptoms.  After 5 days you can break quarantine if your symptoms have improved and you have not had a fever for 24 hours without taking Tylenol or ibuprofen.  Use over-the-counter Tylenol and ibuprofen as needed for body aches and fever.  Use the Tessalon Perles during the day as needed for cough and the Promethazine DM cough syrup at nighttime as will make you drowsy.  If you develop any increased shortness of breath-especially at rest, you are unable to speak in full sentences, or is a late sign your lips are turning blue you need to go the ER for evaluation.  

## 2021-06-06 NOTE — ED Provider Notes (Signed)
[facility]    CSN: [id number] Arrival date & time: 06/06/21  1625      History   Chief Complaint Chief Complaint  Patient presents with   Cough    HPI Dashia Caldeira is a 25 y.o. female.   HPI  60 old female here for evaluation of respiratory complaints.  Patient reports that for last 4 days she has been experiencing nasal congestion with a clear nasal discharge, ear pressure, headache, sore throat, body aches, nonproductive cough, and nausea.  She has not had a fever, changes to hearing, shortness breath or wheezing, vomiting or diarrhea, or known sick contacts.  Past Medical History:  Diagnosis Date   Chiari malformation type I (HCC)    Migraine     There are no problems to display for this patient.   Past Surgical History:  Procedure Laterality Date   NO PAST SURGERIES      OB History   No obstetric history on file.      Home Medications    Prior to Admission medications   Medication Sig Start Date End Date Taking? Authorizing Provider  benzonatate (TESSALON) 100 MG capsule Take 2 capsules (200 mg total) by mouth every 8 (eight) hours. 06/06/21  Yes Becky Augusta, NP  escitalopram (LEXAPRO) 5 MG tablet Take 5 mg by mouth daily. 01/30/21  Yes [provider]  ipratropium (ATROVENT) 0.06 % nasal spray Place 2 sprays into both nostrils 4 (four) times daily. 06/06/21  Yes Becky Augusta, NP  meclizine (ANTIVERT) 25 MG tablet Take 1 tablet (25 mg total) by mouth 3 (three) times daily as needed for dizziness. Take 1/2 tab to 1 tab as needed. 10/11/20  Yes Bast, Traci A, NP  nortriptyline (PAMELOR) 10 MG capsule Take 10 mg by mouth at bedtime. 01/31/21  Yes [provider]  promethazine-dextromethorphan (PROMETHAZINE-DM) 6.25-15 MG/5ML syrup Take 5 mLs by mouth 4 (four) times daily as needed. 06/06/21  Yes Becky Augusta, NP  Vitamin D, Ergocalciferol, (DRISDOL) 1.25 MG (50000 UNIT) CAPS capsule Take 1 capsule by mouth once a week. 11/23/20  Yes  [provider]    Family History Family History  Problem Relation Age of Onset   Healthy Mother    Diabetes Father    Kidney disease Father    Kidney failure Father    Stroke Father    Heart attack Father     Social History Social History   Tobacco Use   Smoking status: Never   Smokeless tobacco: Never  Vaping Use   Vaping Use: Never used  Substance Use Topics   Alcohol use: No   Drug use: Never     Allergies   Penicillins   Review of Systems Review of Systems  Constitutional:  Negative for activity change, appetite change and fever.  HENT:  Positive for congestion, ear pain, rhinorrhea and sore throat.   Respiratory:  Positive for cough. Negative for shortness of breath and wheezing.   Gastrointestinal:  Positive for nausea. Negative for diarrhea and vomiting.  Musculoskeletal:  Negative for arthralgias and myalgias.  Skin:  Negative for rash.  Neurological:  Positive for headaches.  Hematological: Negative.   Psychiatric/Behavioral: Negative.      Physical Exam Triage Vital Signs ED Triage Vitals  Enc Vitals Group     BP 06/06/21 1657 107/68     Pulse Rate 06/06/21 1657 60     Resp 06/06/21 1657 18     Temp 06/06/21 1657 98.1 F (36.7 C)  Temp Source 06/06/21 1657 Oral     SpO2 06/06/21 1657 100 %     Weight 06/06/21 1655 145 lb (65.8 kg)     Height 06/06/21 1655 5\' 4"  (1.626 m)     Head Circumference --      Peak Flow --      Pain Score 06/06/21 1655 6     Pain Loc --      Pain Edu? --      Excl. in GC? --    No data found.  Updated Vital Signs BP 107/68 (BP Location: Right Arm)   Pulse 60   Temp 98.1 F (36.7 C) (Oral)   Resp 18   Ht 5\' 4"  (1.626 m)   Wt 145 lb (65.8 kg)   LMP 06/06/2021   SpO2 100%   BMI 24.89 kg/m   Visual Acuity Right Eye Distance:   Left Eye Distance:   Bilateral Distance:    Right Eye Near:   Left Eye Near:    Bilateral Near:     Physical Exam Vitals and nursing note reviewed.   Constitutional:      General: She is not in acute distress.    Appearance: Normal appearance. She is not ill-appearing.  HENT:     Head: Normocephalic and atraumatic.     Right Ear: Tympanic membrane, ear canal and external ear normal. There is no impacted cerumen.     Left Ear: Tympanic membrane, ear canal and external ear normal. There is no impacted cerumen.     Nose: Congestion and rhinorrhea present.     Mouth/Throat:     Mouth: Mucous membranes are moist.     Pharynx: Oropharyngeal exudate and posterior oropharyngeal erythema present.  Cardiovascular:     Rate and Rhythm: Normal rate and regular rhythm.     Pulses: Normal pulses.     Heart sounds: Normal heart sounds. No murmur heard.   No gallop.  Pulmonary:     Effort: Pulmonary effort is normal.     Breath sounds: Normal breath sounds. No wheezing, rhonchi or rales.  Musculoskeletal:     Cervical back: Normal range of motion and neck supple.  Lymphadenopathy:     Cervical: No cervical adenopathy.  Skin:    General: Skin is warm and dry.     Capillary Refill: Capillary refill takes less than 2 seconds.     Findings: No erythema or rash.  Neurological:     General: No focal deficit present.     Mental Status: She is alert and oriented to person, place, and time.  Psychiatric:        Mood and Affect: Mood normal.        Behavior: Behavior normal.        Thought Content: Thought content normal.        Judgment: Judgment normal.     UC Treatments / Results  Labs (all labs ordered are listed, but only abnormal results are displayed) Labs Reviewed  SARS CORONAVIRUS 2 (TAT 6-24 HRS)  CULTURE, GROUP A STREP [facility])  POCT RAPID STREP A, ED / UC  POCT RAPID STREP A    EKG   Radiology No results found.  Procedures Procedures (including critical care time)  Medications Ordered in UC Medications - No data to display  Initial Impression / Assessment and Plan / UC Course  I have reviewed the triage vital signs  and the nursing notes.  Pertinent labs & imaging results that were available during my care  of the patient were reviewed by me and considered in my medical decision making (see chart for details).  Patient is a very pleasant, nontoxic-appearing 75 old female here for evaluation of respiratory complaints as outlined HPI above that been present for last 4 days.  Patient's physical exam reveals protegrin tympanic membranes bilaterally with a normal light reflex and clear external auditory canals.  Nasal mucosa is erythematous and edematous with clear nasal discharge.  Oropharyngeal exam reveals 2+ edema to bilateral tonsillar pillars with erythema and white exudate.  Posterior pharynx has erythema and swelling clear postnasal drip.  No cervical lymphadenopathy appreciated exam.  Cardiopulmonary exam reveals clear lung sounds in all fields.  Due to patient's protracted length of symptoms I will test her for flu but will swab for COVID and strep.  Rapid strep is negative.  We will send for culture.  Will discharge patient home to isolate pending the results of her COVID test.  Will treat symptomatically now with Atrovent nasal spray to help with nasal congestion, (DM cough syrup from in the [place] to help with cough and congestion, and supportive care.   Final Clinical Impressions(s) / UC Diagnoses   Final diagnoses:  Viral URI with cough     Discharge Instructions      Isolate at home pending the results of your COVID test.  If you test positive then you will have to quarantine for 5 days from the start of your symptoms.  After 5 days you can break quarantine if your symptoms have improved and you have not had a fever for 24 hours without taking Tylenol or ibuprofen.  Use over-the-counter Tylenol and ibuprofen as needed for body aches and fever.  Use the Tessalon Perles during the day as needed for cough and the Promethazine DM cough syrup at nighttime as will make you drowsy.  If you develop  any increased shortness of breath-especially at rest, you are unable to speak in full sentences, or is a late sign your lips are turning blue you need to go the ER for evaluation.      ED Prescriptions     Medication Sig Dispense Auth. Provider   benzonatate (TESSALON) 100 MG capsule Take 2 capsules (200 mg total) by mouth every 8 (eight) hours. 21 capsule Becky Augusta, NP   ipratropium (ATROVENT) 0.06 % nasal spray Place 2 sprays into both nostrils 4 (four) times daily. 15 mL Becky Augusta, NP   promethazine-dextromethorphan (PROMETHAZINE-DM) 6.25-15 MG/5ML syrup Take 5 mLs by mouth 4 (four) times daily as needed. 118 mL Becky Augusta, NP      PDMP not reviewed this encounter.   Becky Augusta, NP 06/06/21 1720

## 2021-06-07 LAB — SARS CORONAVIRUS 2 (TAT 6-24 HRS): SARS Coronavirus 2: POSITIVE — AB

## 2021-06-09 LAB — CULTURE, GROUP A STREP (THRC)

## 2021-09-17 IMAGING — CT CT HEAD W/O CM
3 series · 16 of 47 positions shown, 19 images · non-contrast
Comparison: None.

CLINICAL DATA: Headache

EXAM:
CT HEAD WITHOUT CONTRAST
TECHNIQUE: Contiguous axial images were obtained from the base of the skull
through the vertex without intravenous contrast.

[Series 2: head wo · axial · 0.42mm/px · z∈[+348,+473]mm · 10 of 31 slices shown, 13 images]
[im 3/31  brain]
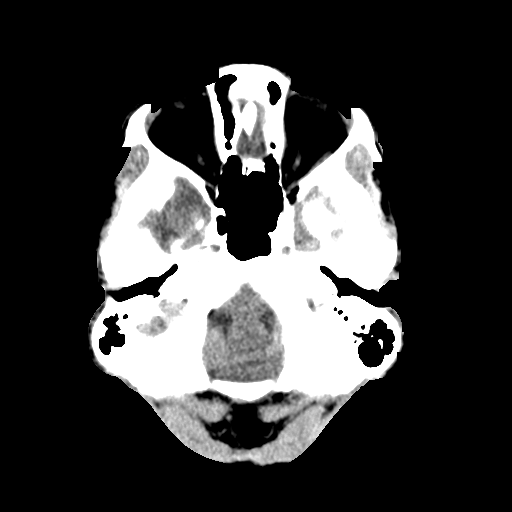
[im 3/31  bone]
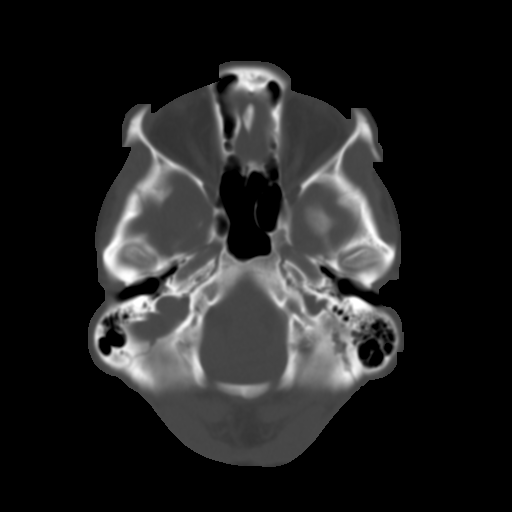
[im 6/31  brain]
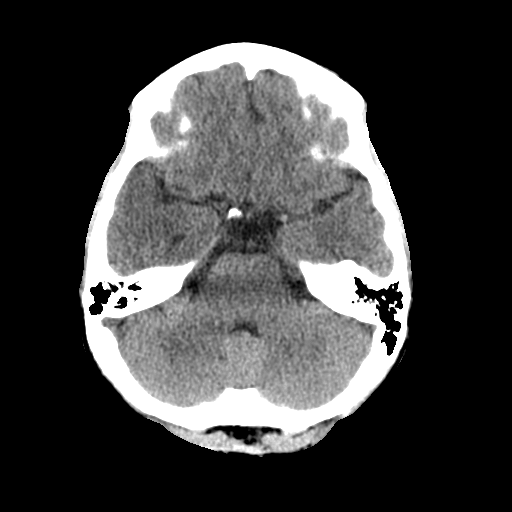
[im 9/31  brain]
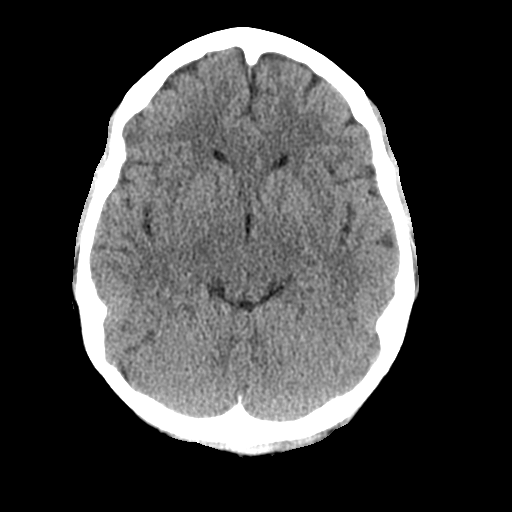
[im 11/31  brain]
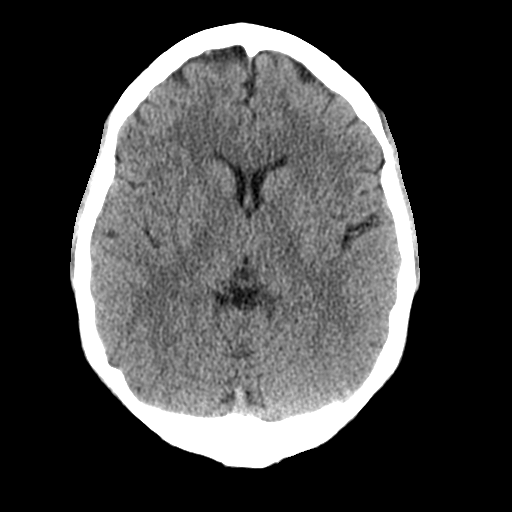
[im 14/31  brain]
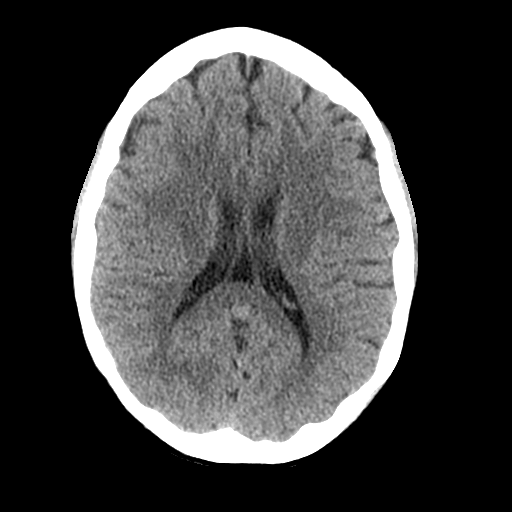
[im 14/31  bone]
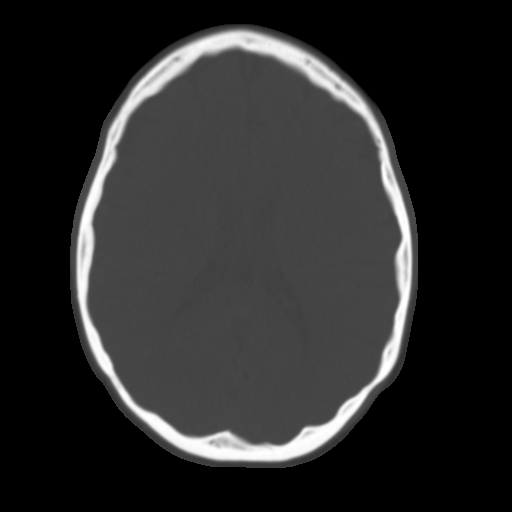
[im 17/31  brain]
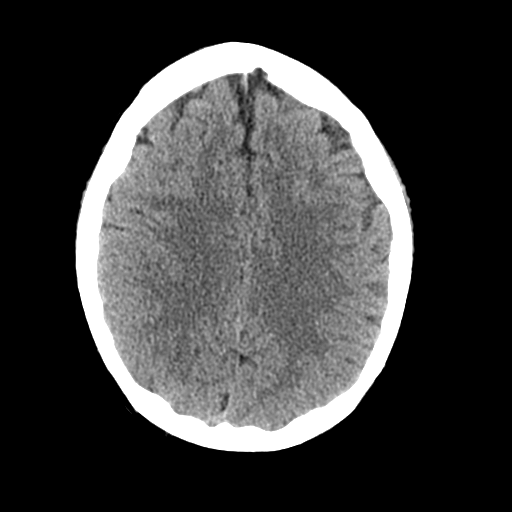
[im 20/31  brain]
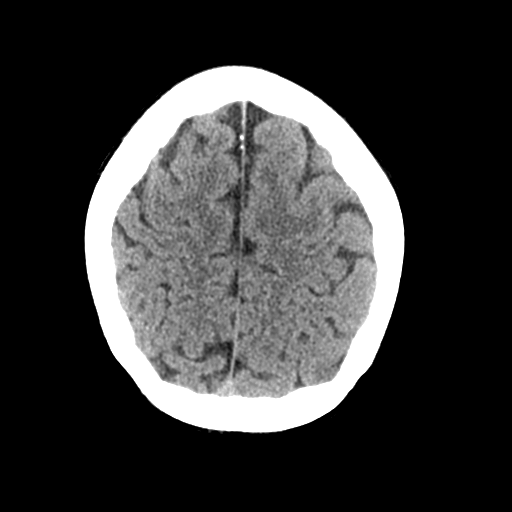
[im 23/31  brain]
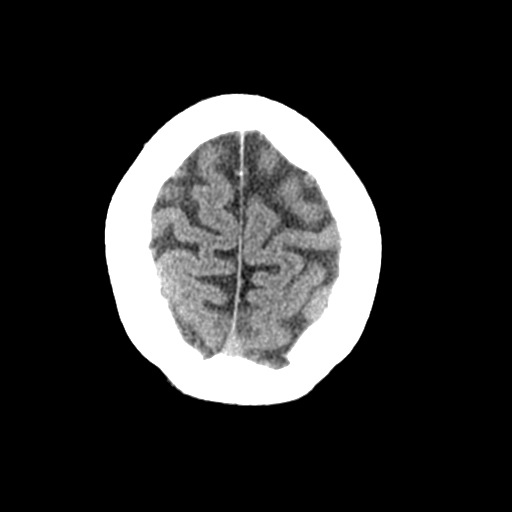
[im 25/31  brain]
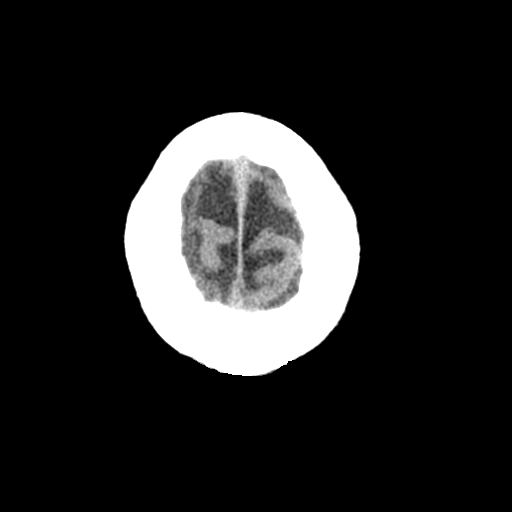
[im 25/31  bone]
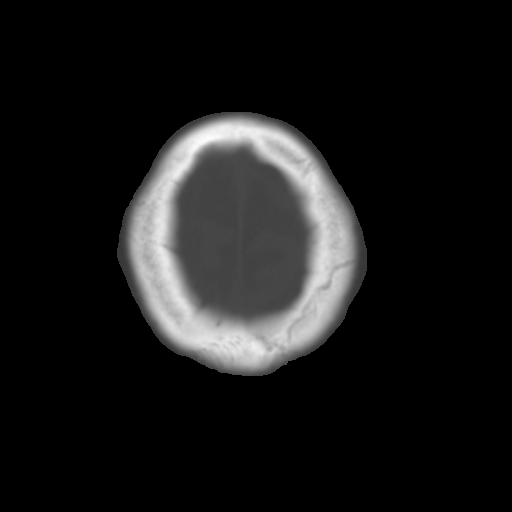
[im 28/31  brain]
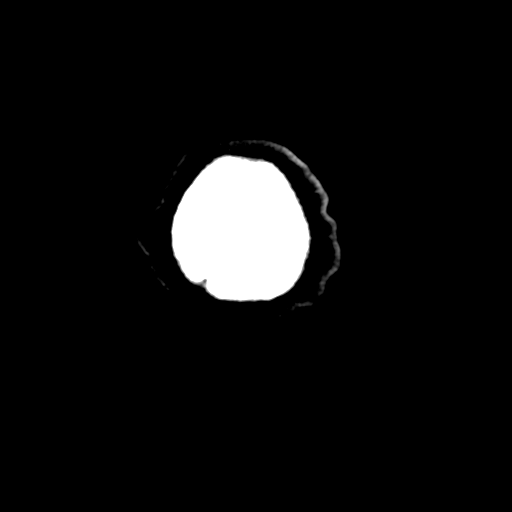

[Series 4: coronal soft tissue · coronal · 0.32mm/px · 3 of 64 slices shown]
[im 22/64  brain]
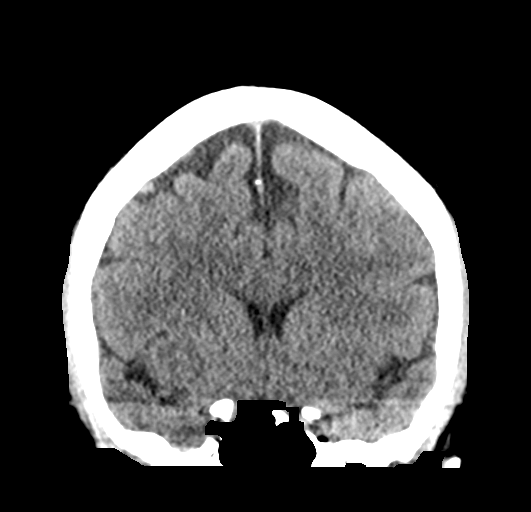
[im 29/64  brain]
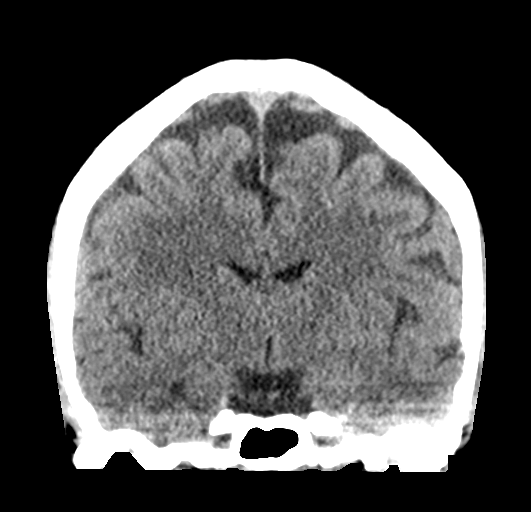
[im 36/64  brain]
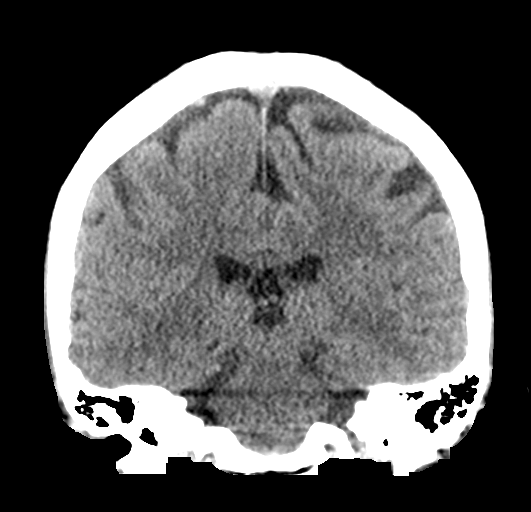

[Series 5: sagittal soft tissue · sagittal · 0.32mm/px · 3 of 54 slices shown]
[im 18/54  brain]
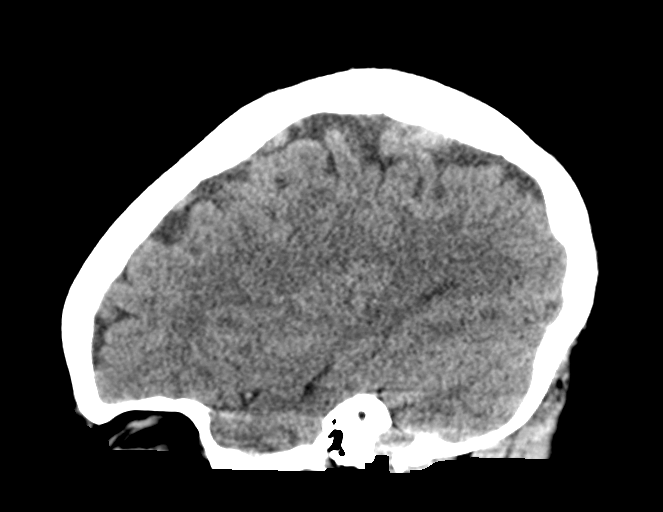
[im 27/54  brain]
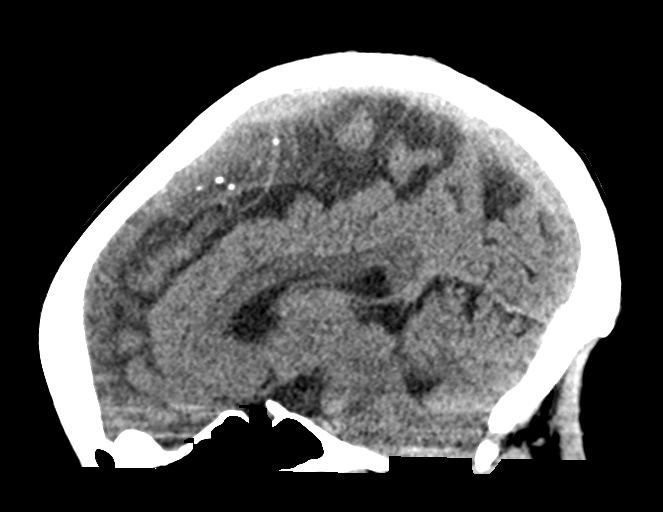
[im 36/54  brain]
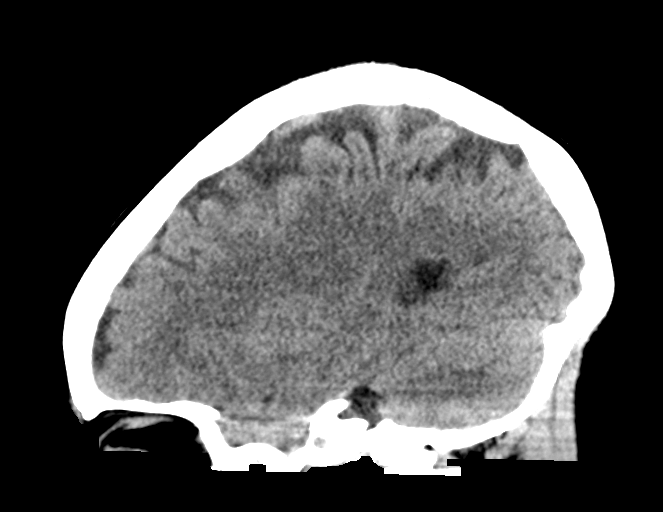

[16 of 47 positions shown; findings below may reference images not displayed]

FINDINGS: Brain: No evidence of acute infarction, hemorrhage, hydrocephalus,
extra-axial collection or mass lesion/mass effect.

Vascular: No hyperdense vessel or unexpected calcification.

Skull: Normal. Negative for fracture or focal lesion.

Sinuses/Orbits: No acute finding.

Other: None
IMPRESSION: Negative non contrasted CT appearance of the brain.
